# Patient Record
Sex: Female | Born: 1990 | Race: Black or African American | Hispanic: No | Marital: Single | Smoking: Never smoker
Health system: Southern US, Community
[De-identification: ages and names within clinical notes are randomized; demographics above are authoritative.]

## PROBLEM LIST (undated history)

## (undated) DIAGNOSIS — Z789 Other specified health status: Secondary | ICD-10-CM

## (undated) HISTORY — DX: Other specified health status: Z78.9

---

## 2014-12-02 HISTORY — PX: BREAST SURGERY: SHX581

## 2017-11-02 ENCOUNTER — Encounter: Payer: Self-pay | Admitting: Emergency Medicine

## 2017-11-02 ENCOUNTER — Other Ambulatory Visit: Payer: Self-pay

## 2017-11-02 ENCOUNTER — Emergency Department
Admission: EM | Admit: 2017-11-02 | Discharge: 2017-11-02 | Disposition: A | Discharge status: Home / Self Care | Payer: Managed Care, Other (non HMO) | Attending: Emergency Medicine | Admitting: Emergency Medicine

## 2017-11-02 DIAGNOSIS — N764 Abscess of vulva: Secondary | ICD-10-CM | POA: Insufficient documentation

## 2017-11-02 DIAGNOSIS — N758 Other diseases of Bartholin's gland: Secondary | ICD-10-CM | POA: Insufficient documentation

## 2017-11-02 MED ORDER — HYDROCODONE-ACETAMINOPHEN 5-325 MG PO TABS: 1.0000 | ORAL_TABLET | Freq: Four times a day (QID) | ORAL | 0 refills | Status: DC | PRN

## 2017-11-02 MED ORDER — LIDOCAINE-EPINEPHRINE 2 %-1:100000 IJ SOLN
30.0000 mL | Freq: Once | INTRAMUSCULAR | Status: DC
  Filled 2017-11-02: qty 30

## 2017-11-02 MED ORDER — SULFAMETHOXAZOLE-TRIMETHOPRIM 800-160 MG PO TABS: 1.0000 | ORAL_TABLET | Freq: Two times a day (BID) | ORAL | 0 refills | Status: DC

## 2017-11-02 NOTE — Discharge Instructions (Signed)
Pull the packing out in 2 days if it has not fallen out already. Take the antibiotic until finished. See the gynecologist or return to the ER for symptoms that change or worsen.

## 2017-11-02 NOTE — ED Provider Notes (Signed)
[facility] Emergency Department Provider Note  ____________________________________________  Time seen: Approximately 8:23 AM  I have reviewed the triage vital signs and the nursing notes.   HISTORY  Chief Complaint Abscess   HPI Melissa Flores is a 26 y.o. female who presents to the ER for I&D of labial cyst. She was evaluated at urgent care on Friday and given a shot of antibiotics, which has helped but pain continues. She states she has a history of the same about 5 years ago. She denies fever or other concerns.   History reviewed. No pertinent past medical history.  There are no active problems to display for this patient.   Past Surgical History:  Procedure Laterality Date  . BREAST SURGERY      Prior to Admission medications   Medication Sig Start Date End Date Taking? Authorizing Provider  HYDROcodone-acetaminophen (NORCO/VICODIN) 5-325 MG tablet Take 1 tablet by mouth every 6 (six) hours as needed for up to 12 doses for moderate pain. 11/02/17   Jeremyah Jelley, Rulon Eisenmengerari B, FNP  sulfamethoxazole-trimethoprim (BACTRIM DS,SEPTRA DS) 800-160 MG tablet Take 1 tablet by mouth 2 (two) times daily. 11/02/17   Chinita Pesterriplett, Jia Dottavio B, FNP    Allergies Patient has no known allergies.  No family history on file.  Social History Social History   Tobacco Use  . Smoking status: Never Smoker  . Smokeless tobacco: Never Used  Substance Use Topics  . Alcohol use: No    Frequency: Never  . Drug use: No    Review of Systems  Constitutional: Negative for fever. Respiratory: Negative for cough or shortness of breath.  Musculoskeletal: Negative for myalgias Skin: Positive for abscess of left labia Neurological: Negative for numbness or paresthesias. ____________________________________________   PHYSICAL EXAM:  VITAL SIGNS: ED Triage Vitals [11/02/17 0801]  Enc Vitals Group     BP 100/70     Pulse Rate 90     Resp 16     Temp 98.1 F (36.7 C)     Temp  Source Oral     SpO2 100 %     Weight      Height      Head Circumference      Peak Flow      Pain Score 6     Pain Loc      Pain Edu?      Excl. in GC?      Constitutional: Well appearing. Eyes: Conjunctivae are clear without discharge or drainage. Nose: No rhinorrhea noted. Mouth/Throat: Airway is patent.  Neck: No stridor. Unrestricted range of motion observed. Lymphatic: No inguinal nodes palpable  Cardiovascular: Capillary refill is <3 seconds.  Respiratory: Respirations are even and unlabored.. Musculoskeletal: Unrestricted range of motion observed. Neurologic: Awake, alert, and oriented x 4.  Skin:  Left labia majora fluctuant and erythematous  ____________________________________________   LABS (all labs ordered are listed, but only abnormal results are displayed)  Labs Reviewed - No data to display ____________________________________________  EKG  Not indicated. ____________________________________________  RADIOLOGY  Not indicated. ____________________________________________   PROCEDURES  .Marland Kitchen.Incision and Drainage Date/Time: 11/02/2017 10:25 AM Performed by: Chinita Pesterriplett, Alonte Wulff B, FNP Authorized by: Chinita Pesterriplett, Matie Dimaano B, FNP   Consent:    Consent obtained:  Verbal   Consent given by:  Patient   Risks discussed:  Bleeding, infection, incomplete drainage and pain   Alternatives discussed:  Alternative treatment, delayed treatment and observation Location:    Type:  Abscess   Location:  Anogenital   Anogenital location: Left labia.  Pre-procedure details:    Skin preparation:  Betadine Anesthesia (see MAR for exact dosages):    Anesthesia method:  Local infiltration   Local anesthetic:  Lidocaine 1% w/o epi Procedure type:    Complexity:  Complex Procedure details:    Needle aspiration: no     Incision types:  Stab incision   Incision depth:  Dermal   Scalpel blade:  11   Wound management:  Probed and deloculated   Drainage:  Bloody and purulent    Drainage amount:  Moderate   Wound treatment:  Drain placed   Packing materials:  1/4 in iodoform gauze Post-procedure details:    Patient tolerance of procedure:  Tolerated well, no immediate complications   ____________________________________________   INITIAL IMPRESSION / ASSESSMENT AND PLAN / ED COURSE  Melissa ShuJenessa Mokry is a 26 y.o. female who presents to the emergency department for I&D of a labia abscess. She tolerated the procedure well. She is to pull the packing out in 2 days and take a course of Bactrim. She is to follow up with gynecology for further evaluation if needed and if the infection returns. She is to return to the ER for symptoms that change or worsen if unable to schedule an appointment.  Medications  lidocaine-EPINEPHrine (XYLOCAINE W/EPI) 2 %-1:100000 (with pres) injection 30 mL (not administered)     Pertinent labs & imaging results that were available during my care of the patient were reviewed by me and considered in my medical decision making (see chart for details). ____________________________________________   FINAL CLINICAL IMPRESSION(S) / ED DIAGNOSES  Final diagnoses:  Left genital labial abscess    ED Discharge Orders        Ordered    sulfamethoxazole-trimethoprim (BACTRIM DS,SEPTRA DS) 800-160 MG tablet  2 times daily     11/02/17 0905    HYDROcodone-acetaminophen (NORCO/VICODIN) 5-325 MG tablet  Every 6 hours PRN     11/02/17 0905       Note:  This document was prepared using Dragon voice recognition software and may include unintentional dictation errors.    Chinita Pesterriplett, Ephrem Carrick B, FNP 11/02/17 1031    Sharyn CreamerQuale, Mark, MD 11/02/17 [phone]

## 2017-11-02 NOTE — ED Triage Notes (Signed)
Pt states that she was seen at urgent care on Friday for left labia cyst, pt was instructed to come to ED to have cyst drained but was unable to come on Friday. Pt states that area is painful, hurt to sit. Pt in NAD.

## 2017-11-02 NOTE — ED Notes (Signed)
First Nurse Note: Pt ambulatory to front desk c/o cyst on left labia that she would like to have drained. Pt in NAD at this time.

## 2017-11-05 ENCOUNTER — Encounter: Payer: Self-pay | Admitting: Obstetrics & Gynecology

## 2017-11-13 ENCOUNTER — Encounter: Payer: Self-pay | Admitting: Obstetrics and Gynecology

## 2017-11-13 ENCOUNTER — Ambulatory Visit (INDEPENDENT_AMBULATORY_CARE_PROVIDER_SITE_OTHER): Payer: Self-pay | Admitting: Obstetrics and Gynecology

## 2017-11-13 VITALS — BP 110/60 | HR 65 | Ht 62.0 in | Wt 103.0 lb

## 2017-11-13 DIAGNOSIS — N758 Other diseases of Bartholin's gland: Secondary | ICD-10-CM

## 2017-11-13 DIAGNOSIS — Z09 Encounter for follow-up examination after completed treatment for conditions other than malignant neoplasm: Secondary | ICD-10-CM

## 2017-11-13 NOTE — Progress Notes (Signed)
   Patient ID: Melissa Flores, female   DOB: 09/05/1991, 26 y.o.   MRN: [id number]  Reason for Consult: Follow-up (ER f/u labial abcess)   Referred by No ref. provider found  Subjective:     HPI:  Melissa Flores is a 26 y.o. female  Patient following up from the ER for drainage of a bartholin gland abscess. Healing well. Some discomfort with intercourse.  No drainage, no induration.   Past Medical History:  Diagnosis Date  . No known health problems    Family History  Problem Relation Age of Onset  . Endometriosis Mother   . Hypertension Mother   . Hepatitis C Maternal Aunt   . Diabetes Maternal Aunt   . Kidney failure Maternal Aunt   . Aneurysm Paternal Grandmother   . COPD Paternal Grandmother    Past Surgical History:  Procedure Laterality Date  . BREAST SURGERY  2016   lump on left breast    Short Social History:  Social History   Tobacco Use  . Smoking status: Never Smoker  . Smokeless tobacco: Never Used  Substance Use Topics  . Alcohol use: No    Frequency: Never    No Known Allergies  Current Outpatient Medications  Medication Sig Dispense Refill  . HYDROcodone-acetaminophen (NORCO/VICODIN) 5-325 MG tablet Take 1 tablet by mouth every 6 (six) hours as needed for up to 12 doses for moderate pain. (Patient not taking: Reported on 11/13/2017) 12 tablet 0  . sulfamethoxazole-trimethoprim (BACTRIM DS,SEPTRA DS) 800-160 MG tablet Take 1 tablet by mouth 2 (two) times daily. (Patient not taking: Reported on 11/13/2017) 20 tablet 0   No current facility-administered medications for this visit.     Review of Systems  Constitutional:  Constitutional negative. HENT: HENT negative.  Respiratory: Respiratory negative.  Cardiovascular: Cardiovascular negative.  GI: Gastrointestinal negative.  GU: Genitourinary negative. Musculoskeletal: Musculoskeletal negative.  Skin: Skin negative.  Neurological: Neurological negative. Hematologic: Hematologic/lymphatic  negative.  Psychiatric: Psychiatric negative.        Objective:  Objective   Vitals:   11/13/17 1338  BP: 110/60  Pulse: 65  Weight: 103 lb (46.7 kg)  Height: 5\' 2"  (1.575 m)   Body mass index is 18.84 kg/m.  Physical Exam  Constitutional: She appears well-developed and well-nourished.  Cardiovascular: Normal rate and regular rhythm.  Pulmonary/Chest: Effort normal and breath sounds normal.  Abdominal: Soft. Bowel sounds are normal.  Genitourinary: Vagina normal and uterus normal.  Genitourinary Comments: Small incision site inside of vaginal introitus on patients left side. Healing well. Tender to palpation. No drainage, no induration.        Assessment/Plan:    25yo with recent Bartholin Gland drainage.  Healing well, recommended vasoline and 2 weeks of pelvic rest to allow incision site to fully heal  Natale Milchhristanna R Orlo Brickle MD 11/16/17 10:18 PM

## 2017-11-27 ENCOUNTER — Ambulatory Visit: Payer: Medicaid Other | Admitting: Obstetrics and Gynecology

## 2017-12-18 ENCOUNTER — Ambulatory Visit: Payer: Medicaid Other | Admitting: Obstetrics and Gynecology

## 2018-02-20 ENCOUNTER — Encounter: Payer: Self-pay | Admitting: Advanced Practice Midwife

## 2018-03-04 ENCOUNTER — Encounter: Payer: Self-pay | Admitting: Emergency Medicine

## 2018-03-04 ENCOUNTER — Other Ambulatory Visit: Payer: Self-pay

## 2018-03-04 ENCOUNTER — Emergency Department
Admission: EM | Admit: 2018-03-04 | Discharge: 2018-03-04 | Disposition: A | Discharge status: Home / Self Care | Payer: Managed Care, Other (non HMO) | Attending: Emergency Medicine | Admitting: Emergency Medicine

## 2018-03-04 ENCOUNTER — Emergency Department: Payer: Managed Care, Other (non HMO)

## 2018-03-04 DIAGNOSIS — R109 Unspecified abdominal pain: Secondary | ICD-10-CM | POA: Diagnosis not present

## 2018-03-04 DIAGNOSIS — O99611 Diseases of the digestive system complicating pregnancy, first trimester: Secondary | ICD-10-CM | POA: Insufficient documentation

## 2018-03-04 DIAGNOSIS — O26899 Other specified pregnancy related conditions, unspecified trimester: Secondary | ICD-10-CM

## 2018-03-04 DIAGNOSIS — R102 Pelvic and perineal pain: Secondary | ICD-10-CM

## 2018-03-04 DIAGNOSIS — K529 Noninfective gastroenteritis and colitis, unspecified: Secondary | ICD-10-CM | POA: Diagnosis not present

## 2018-03-04 DIAGNOSIS — Z3A01 Less than 8 weeks gestation of pregnancy: Secondary | ICD-10-CM | POA: Diagnosis not present

## 2018-03-04 DIAGNOSIS — O26891 Other specified pregnancy related conditions, first trimester: Secondary | ICD-10-CM

## 2018-03-04 LAB — ABO/RH: ABO/RH(D): O POS

## 2018-03-04 LAB — CBC WITH DIFFERENTIAL/PLATELET
BASOS PCT: 0 %
Basophils Absolute: 0 10*3/uL (ref 0–0.1)
EOS PCT: 0 %
Eosinophils Absolute: 0 10*3/uL (ref 0–0.7)
HCT: 36.3 % (ref 35.0–47.0)
HEMOGLOBIN: 12.1 g/dL (ref 12.0–16.0)
Lymphocytes Relative: 21 %
Lymphs Abs: 2.3 10*3/uL (ref 1.0–3.6)
MCH: 29.6 pg (ref 26.0–34.0)
MCHC: 33.5 g/dL (ref 32.0–36.0)
MCV: 88.2 fL (ref 80.0–100.0)
MONOS PCT: 6 %
Monocytes Absolute: 0.7 10*3/uL (ref 0.2–0.9)
NEUTROS PCT: 73 %
Neutro Abs: 7.7 10*3/uL — ABNORMAL HIGH (ref 1.4–6.5)
Platelets: 231 10*3/uL (ref 150–440)
RBC: 4.11 MIL/uL (ref 3.80–5.20)
RDW: 12.7 % (ref 11.5–14.5)
WBC: 10.7 10*3/uL (ref 3.6–11.0)

## 2018-03-04 LAB — HCG, QUANTITATIVE, PREGNANCY: hCG, Beta Chain, Quant, S: 84316 m[IU]/mL — ABNORMAL HIGH (ref <5)

## 2018-03-04 LAB — URINALYSIS, COMPLETE (UACMP) WITH MICROSCOPIC
BACTERIA UA: NONE SEEN
Bilirubin Urine: NEGATIVE
Glucose, UA: >=500 mg/dL — AB
Hgb urine dipstick: NEGATIVE
Ketones, ur: 20 mg/dL — AB
Leukocytes, UA: NEGATIVE
Nitrite: NEGATIVE
Protein, ur: NEGATIVE mg/dL
SPECIFIC GRAVITY, URINE: 1.002 — AB (ref 1.005–1.030)
pH: 8 (ref 5.0–8.0)

## 2018-03-04 LAB — COMPREHENSIVE METABOLIC PANEL
ALBUMIN: 4.3 g/dL (ref 3.5–5.0)
ALK PHOS: 47 U/L (ref 38–126)
ALT: 13 U/L — ABNORMAL LOW (ref 14–54)
ANION GAP: 7 (ref 5–15)
AST: 18 U/L (ref 15–41)
BUN: <5 mg/dL — ABNORMAL LOW (ref 6–20)
CALCIUM: 9.4 mg/dL (ref 8.9–10.3)
CO2: 22 mmol/L (ref 22–32)
Chloride: 107 mmol/L (ref 101–111)
Creatinine, Ser: 0.61 mg/dL (ref 0.44–1.00)
GFR calc non Af Amer: >60 mL/min (ref >60)
GLUCOSE: 83 mg/dL (ref 65–99)
POTASSIUM: 3.6 mmol/L (ref 3.5–5.1)
Sodium: 136 mmol/L (ref 135–145)
Total Bilirubin: 0.8 mg/dL (ref 0.3–1.2)
Total Protein: 7.7 g/dL (ref 6.5–8.1)

## 2018-03-04 LAB — LIPASE, BLOOD: Lipase: 21 U/L (ref 11–51)

## 2018-03-04 MED ORDER — ONDANSETRON 4 MG PO TBDP: 4.0000 mg | ORAL_TABLET | Freq: Three times a day (TID) | ORAL | 0 refills | Status: AC | PRN

## 2018-03-04 MED ORDER — DEXTROSE 5 % AND 0.9 % NACL IV BOLUS: 1000.0000 mL | Freq: Once | INTRAVENOUS | Status: DC

## 2018-03-04 MED ORDER — ONDANSETRON HCL 4 MG/2ML IJ SOLN
4.0000 mg | Freq: Once | INTRAMUSCULAR | Status: AC
  Administered 2018-03-04: 4 mg via INTRAVENOUS
  Filled 2018-03-04: qty 2

## 2018-03-04 MED ORDER — DEXTROSE IN LACTATED RINGERS 5 % IV SOLN
INTRAVENOUS | Status: DC
  Administered 2018-03-04: 08:00:00 via INTRAVENOUS

## 2018-03-04 MED ORDER — DEXTROSE IN LACTATED RINGERS 5 % IV SOLN
INTRAVENOUS | Status: DC
Start: 2018-03-04 — End: 2018-03-04
  Administered 2018-03-04: 08:00:00 via INTRAVENOUS

## 2018-03-04 MED ORDER — PROMETHAZINE HCL 25 MG/ML IJ SOLN
25.0000 mg | Freq: Once | INTRAMUSCULAR | Status: AC
  Administered 2018-03-04: 25 mg via INTRAVENOUS
  Filled 2018-03-04: qty 1

## 2018-03-04 MED ORDER — LORAZEPAM 2 MG/ML IJ SOLN
0.5000 mg | Freq: Once | INTRAMUSCULAR | Status: AC
  Administered 2018-03-04: 0.5 mg via INTRAVENOUS
  Filled 2018-03-04: qty 1

## 2018-03-04 NOTE — ED Notes (Signed)
Pt sitting up in bed attempting to call a ride. Pt given meal tray and saltines per her request. Will continue to monitor for further patient needs.

## 2018-03-04 NOTE — ED Notes (Signed)
Pt resting comfortably in bed at this time with eyes closed. Respirations even and unlabored. Skin warm, dry, and intact. Will continue to monitor for further patient needs.

## 2018-03-04 NOTE — ED Notes (Signed)
Pt returned from US at this time.

## 2018-03-04 NOTE — ED Notes (Signed)
Pt states when she was attempting to eat meal tray that was provided she began to vomit. MD made aware.

## 2018-03-04 NOTE — ED Provider Notes (Signed)
[facility] Emergency Department Provider Note       Time seen: ----------------------------------------- 7:30 AM on 03/04/2018 -----------------------------------------   I have reviewed the triage vital signs and the nursing notes.  HISTORY   Chief Complaint Abdominal Pain   HPI Melissa Flores is a 27 y.o. female with no significant past medical history who presents to the ED for abdominal pain that started this morning.  Patient also complains of nausea, vomiting and diarrhea for the past several days.  Patient states she is around [redacted] weeks pregnant, denies any vaginal bleeding or leakage of fluid.  She is G1 P0.  Patient states symptoms worsened at approximately 3:00 this morning, she arrives by EMS for same.  Past Medical History:  Diagnosis Date  . No known health problems     There are no active problems to display for this patient.   Past Surgical History:  Procedure Laterality Date  . BREAST SURGERY  2016   lump on left breast    Allergies Patient has no known allergies.  Social History Social History   Tobacco Use  . Smoking status: Never Smoker  . Smokeless tobacco: Never Used  Substance Use Topics  . Alcohol use: No    Frequency: Never  . Drug use: No   Review of Systems Constitutional: Negative for fever. Cardiovascular: Negative for chest pain. Respiratory: Negative for shortness of breath. Gastrointestinal: Positive for abdominal pain, vomiting and diarrhea Genitourinary: Negative for dysuria. negative for vaginal bleeding Musculoskeletal: Negative for back pain. Skin: Negative for rash. Neurological: Negative for headaches, focal weakness or numbness.  All systems negative/normal/unremarkable except as stated in the HPI  ____________________________________________   PHYSICAL EXAM:  VITAL SIGNS: ED Triage Vitals  Enc Vitals Group     BP 03/04/18 0729 132/86     Pulse Rate 03/04/18 0729 86     Resp 03/04/18 0729  18     Temp --      Temp src --      SpO2 03/04/18 0729 100 %     Weight 03/04/18 0730 108 lb (49 kg)     Height 03/04/18 0730 5\' 2"  (1.575 m)     Head Circumference --      Peak Flow --      Pain Score 03/04/18 0730 8     Pain Loc --      Pain Edu? --      Excl. in GC? --    Constitutional: Alert and oriented.  Anxious, no distress Eyes: Conjunctivae are normal. Normal extraocular movements. ENT   Head: Normocephalic and atraumatic.   Nose: No congestion/rhinnorhea.   Mouth/Throat: Mucous membranes are moist.   Neck: No stridor. Cardiovascular: Normal rate, regular rhythm. No murmurs, rubs, or gallops. Respiratory: Normal respiratory effort without tachypnea nor retractions. Breath sounds are clear and equal bilaterally. No wheezes/rales/rhonchi. Gastrointestinal: Nonfocal tenderness, no rebound or guarding.  Normal bowel sounds. Musculoskeletal: Nontender with normal range of motion in extremities. No lower extremity tenderness nor edema. Neurologic:  Normal speech and language. No gross focal neurologic deficits are appreciated.  Skin:  Skin is warm, dry and intact. No rash noted. Psychiatric: Anxious mood and affect ____________________________________________  ED COURSE:  As part of my medical decision making, I reviewed the following data within the electronic MEDICAL RECORD NUMBER History obtained from family if available, nursing notes, old chart and ekg, as well as notes from prior ED visits. Patient presented for symptoms of gastroenteritis and pregnancy, we will assess with  labs and imaging as indicated at this time.   Procedures ____________________________________________   LABS (pertinent positives/negatives)  Labs Reviewed  CBC WITH DIFFERENTIAL/PLATELET - Abnormal; Notable for the following components:      Result Value   Neutro Abs 7.7 (*)    All other components within normal limits  COMPREHENSIVE METABOLIC PANEL - Abnormal; Notable for the  following components:   BUN <5 (*)    ALT 13 (*)    All other components within normal limits  HCG, QUANTITATIVE, PREGNANCY - Abnormal; Notable for the following components:   hCG, Beta Chain, Quant, S 84,316 (*)    All other components within normal limits  URINALYSIS, COMPLETE (UACMP) WITH MICROSCOPIC - Abnormal; Notable for the following components:   Color, Urine STRAW (*)    APPearance CLEAR (*)    Specific Gravity, Urine 1.002 (*)    Glucose, UA >=500 (*)    Ketones, ur 20 (*)    Squamous Epithelial / LPF 0-5 (*)    All other components within normal limits  LIPASE, BLOOD  ABO/RH    RADIOLOGY  Pregnancy ultrasound  IMPRESSION: 1. Unremarkable single intrauterine pregnancy with estimated gestational age of [redacted] weeks and 4 days. 2. Normal sonographic appearance of the ovaries bilaterally with normal arterial and venous waveforms. 3. Probable corpus luteal cyst on the left. 4. Small amount of free fluid.  The  ____________________________________________  DIFFERENTIAL DIAGNOSIS   Gastroenteritis, gastritis, ectopic pregnancy, normal pregnancy  FINAL ASSESSMENT AND PLAN  Gastroenteritis, first trimester pregnancy   Plan: The patient had presented for symptoms of gastroenteritis in the first trimester pregnancy. Patient's labs were reassuring. Patient's imaging was also reassuring.  She is tolerating food and liquids by mouth.  She will be discharged with Zofran, she is stable for outpatient follow-up.   Ulice Dash, MD   Note: This note was generated in part or whole with voice recognition software. Voice recognition is usually quite accurate but there are transcription errors that can and very often do occur. I apologize for any typographical errors that were not detected and corrected.     Melissa Filbert, MD 03/04/18 1055

## 2018-03-04 NOTE — ED Notes (Signed)
NAD noted at time of D/C. While this RN was reviewing D/C instructions pt states that she is angry with [employer] due to being charged for cancellation. Pt appears distracted during review of D/C instructions. When this RN asked patient to sign for D/C instructions pt sat in bed for several minutes prior to coming to sign for D/C instructions, ignoring this RN in hallway. Pt denies any comments or concerns. Pt ambulatory to the bathroom without difficulty. This RN reiterated that patient was okay for D/C after signing and getting dressed, pt states understanding then sat down on bed and began to attempt to call [employer] regarding her charges to her account. Collyn, RN made aware of patient situation.

## 2018-03-04 NOTE — ED Notes (Signed)
Pt encouraged to attempt to eat after receiving Zofran. Will recheck patient progress and continue to monitor.

## 2018-03-04 NOTE — ED Triage Notes (Signed)
Pt presents to ED via [facility] with c/o upper abdominal pain that started this morning. Per EMS pt also c/o N/V/D x several days. Pt states symptoms worsened this morning at approx 0300. Per EMS LMP was 01/11/2018. Pt states she is [redacted] weeks pregnant, pt denies any vaginal bleeding at this time.

## 2018-03-04 NOTE — ED Notes (Signed)
Pt assisted to the bathroom by this RN. UA collected. Pt back to bed without incident. Will continue to monitor for further patient needs.

## 2018-03-04 NOTE — ED Notes (Signed)
Pt resting in bed with NAD noted. Will continue to monitor for further patient needs. Will attempt to PO challenge patient again since she received the Zofran.

## 2018-03-04 NOTE — ED Notes (Signed)
Pt transported to US

## 2018-03-06 ENCOUNTER — Ambulatory Visit (INDEPENDENT_AMBULATORY_CARE_PROVIDER_SITE_OTHER): Payer: Managed Care, Other (non HMO) | Admitting: Advanced Practice Midwife

## 2018-03-06 ENCOUNTER — Encounter: Payer: Self-pay | Admitting: Advanced Practice Midwife

## 2018-03-06 VITALS — BP 114/78 | Ht 62.0 in | Wt 108.0 lb

## 2018-03-06 DIAGNOSIS — Z09 Encounter for follow-up examination after completed treatment for conditions other than malignant neoplasm: Secondary | ICD-10-CM | POA: Diagnosis not present

## 2018-03-06 DIAGNOSIS — R109 Unspecified abdominal pain: Secondary | ICD-10-CM | POA: Diagnosis not present

## 2018-03-06 NOTE — Progress Notes (Signed)
  History of Present Illness:  Melissa Flores is a 27 y.o. G1P0 who is in the office today for a follow up from an ED visit 2 days ago. She had been having nausea, vomiting and diarrhea for several days and she was diagnosed with gastroenteritis. She also had an OB u/s with [facility] dating [id number]. She denies nausea and vomiting earlier in the pregnancy. She was discharged with a prescription for zofran. Her vomiting has stopped since her visit to the ED. She does complain of continuing "stomach" cramping (while indicating general abdominal area) and says she feels as bad as she did when she went to the ED. Today she denies fever, nausea, vomiting, diarrhea, constipation. She plans to have an abortion next Saturday.   She requests STD testing today since she was told by her mother that her abdominal pain could be PID. The patient has a new sexual partner since November of last year. She denies itching, irritation, burning, discharge or odor. She has not been using birth control or condoms to prevent STDs and she has not been tested recently. She is undecided on method of birth control at this time.   Current Meds  Medication Sig  . hydrOXYzine (ATARAX/VISTARIL) 10 MG tablet Take 1 tablet by mouth daily.  . ondansetron (ZOFRAN ODT) 4 MG disintegrating tablet Take 1 tablet (4 mg total) by mouth every 8 (eight) hours as needed for nausea or vomiting.    PMHx: She  has a past medical history of No known health problems. Also,  has a past surgical history that includes Breast surgery (2016)., family history includes Aneurysm in her paternal grandmother; COPD in her paternal grandmother; Diabetes in her maternal aunt; Endometriosis in her mother; Hepatitis C in her maternal aunt; Hypertension in her mother; Kidney failure in her maternal aunt.,  reports that she has never smoked. She has never used smokeless tobacco. She reports that she does not drink alcohol or use drugs.   Has no known allergies  Review of Systems   Constitutional: Negative.   HENT: Negative.   Eyes: Negative.   Respiratory: Negative.   Cardiovascular: Negative.   Gastrointestinal: Positive for abdominal pain.  Genitourinary: Positive for frequency.  Musculoskeletal: Negative.   Skin: Negative.   Neurological: Positive for headaches.  Endo/Heme/Allergies: Negative.   Psychiatric/Behavioral: Negative.     Physical Exam:  BP 114/78   Ht 5\' 2"  (1.575 m)   Wt 108 lb (49 kg)   LMP 01/11/2018   BMI 19.75 kg/m  Body mass index is 19.75 kg/m. Constitutional: Well nourished, well developed female in no acute distress.  Abdomen: diffusely mildly tender to palpation, non distended, and no masses, hernias Neuro: Grossly intact Psych:  Normal mood and affect.   Genital: EGBUS within normal limits Vagina: normal mucosa, scant discharge, no odor Cervix: appearance normal  Assessment:  Problem List Items Addressed This Visit    None    Visit Diagnoses    Follow up    -  Primary   Abdominal cramping       Relevant Orders   Chlamydia/Gonococcus/Trichomonas, NAA       Plan: Follow up PRN and for annual exam/PAP smear  Bland diet and advance as tolerated  Increase hydration  Follow up with urgent care for continued or worsening abdominal cramping/gastroenteritis symptoms  Use birth control/condoms to prevent pregnancy/STDs   Melissa Flores, CNM [facility], [facility] 03/06/2018  11:20 AM

## 2018-03-09 LAB — CHLAMYDIA/GONOCOCCUS/TRICHOMONAS, NAA
CHLAMYDIA BY NAA: NEGATIVE
GONOCOCCUS BY NAA: NEGATIVE
Trich vag by NAA: NEGATIVE

## 2018-03-11 ENCOUNTER — Ambulatory Visit (INDEPENDENT_AMBULATORY_CARE_PROVIDER_SITE_OTHER): Payer: Managed Care, Other (non HMO) | Admitting: Obstetrics and Gynecology

## 2018-03-11 ENCOUNTER — Encounter: Payer: Self-pay | Admitting: Obstetrics and Gynecology

## 2018-03-11 VITALS — BP 110/68 | Ht 62.0 in | Wt 106.0 lb

## 2018-03-11 DIAGNOSIS — R1032 Left lower quadrant pain: Secondary | ICD-10-CM | POA: Diagnosis not present

## 2018-03-11 DIAGNOSIS — O26899 Other specified pregnancy related conditions, unspecified trimester: Secondary | ICD-10-CM

## 2018-03-11 NOTE — Progress Notes (Addendum)
Obstetrics & Gynecology Office Visit   Chief Complaint  Patient presents with  . Follow-up  . Pelvic Pain   History of Present Illness: 27 y.o. G1P0000 female at approximately [redacted]w[redacted]d gestational age with an EDD of 10/17/18. She presents with continued abdominal pain. Her pain is in her lower abdomen and the pain does not radiate. She has a hard time describing her pain, but states that it is most like a pressure sensation.  Nothing alleviates her pain (she has tried a heating pad and zofran), bending over aggravates the pain. Associated symptoms include nausea (no emesis when she takes her previously prescribed zofran). She is also quite constipated and can't tolerate eating very much food.  Denies vaginal bleeding, hematuria, and hematochezia and melena.  She plans to terminate this pregnancy in the next few days. She otherwise has not formally began prenatal care.  She denies any inciting event. She has had no abdominal trauma. She had a gastrointestinal viral illness last week with which she had nausea, emesis, and diarrhea.    Past Medical History:  Diagnosis Date  . No known health problems     Past Surgical History:  Procedure Laterality Date  . BREAST SURGERY  2016   lump on left breast    Gynecologic History: Patient's last menstrual period was 01/11/2018.  Obstetric History: G1P0000  Family History  Problem Relation Age of Onset  . Endometriosis Mother   . Hypertension Mother   . Hepatitis C Maternal Aunt   . Diabetes Maternal Aunt   . Kidney failure Maternal Aunt   . Aneurysm Paternal Grandmother   . COPD Paternal Grandmother     Social History   Socioeconomic History  . Marital status: Single    Spouse name: Not on file  . Number of children: Not on file  . Years of education: Not on file  . Highest education level: Not on file  Occupational History  . Not on file  Social Needs  . Financial resource strain: Not on file  . Food insecurity:    Worry: Not on  file    Inability: Not on file  . Transportation needs:    Medical: Not on file    Non-medical: Not on file  Tobacco Use  . Smoking status: Never Smoker  . Smokeless tobacco: Never Used  Substance and Sexual Activity  . Alcohol use: No    Frequency: Never  . Drug use: No  . Sexual activity: Yes    Birth control/protection: None  Lifestyle  . Physical activity:    Days per week: 0 days    Minutes per session: 0 min  . Stress: Not at all  Relationships  . Social connections:    Talks on phone: Not on file    Gets together: Not on file    Attends religious service: Not on file    Active member of club or organization: Not on file    Attends meetings of clubs or organizations: Not on file    Relationship status: Not on file  . Intimate partner violence:    Fear of current or ex partner: Not on file    Emotionally abused: Not on file    Physically abused: Not on file    Forced sexual activity: Not on file  Other Topics Concern  . Not on file  Social History Narrative  . Not on file    No Known Allergies  Prior to Admission medications   Medication Sig Start Date End Date  Taking? Authorizing Provider  hydrOXYzine (ATARAX/VISTARIL) 10 MG tablet Take 1 tablet by mouth daily. 02/14/18   [provider]  ondansetron (ZOFRAN ODT) 4 MG disintegrating tablet Take 1 tablet (4 mg total) by mouth every 8 (eight) hours as needed for nausea or vomiting. 03/04/18   Emily FilbertWilliams, Jonathan E, MD    Review of Systems  Constitutional: Negative for chills, fever and malaise/fatigue.  HENT: Negative.   Eyes: Negative.   Respiratory: Negative.   Cardiovascular: Negative.   Gastrointestinal: Positive for abdominal pain, constipation and nausea. Negative for blood in stool, diarrhea, heartburn, melena and vomiting.  Genitourinary: Negative.   Musculoskeletal: Negative.   Skin: Negative.   Neurological: Negative.   Endo/Heme/Allergies: Negative.   Psychiatric/Behavioral: Negative.        Physical Exam BP 110/68   Ht 5\' 2"  (1.575 m)   Wt 106 lb (48.1 kg)   LMP 01/11/2018   BMI 19.39 kg/m  Patient's last menstrual period was 01/11/2018. Physical Exam  Constitutional: She is oriented to person, place, and time. She appears well-developed and well-nourished. She appears distressed (mild).  HENT:  Head: Normocephalic and atraumatic.  Eyes: Conjunctivae are normal. No scleral icterus.  Neck: Normal range of motion. Neck supple. No thyromegaly present.  Cardiovascular: Normal rate and regular rhythm. Exam reveals no gallop and no friction rub.  No murmur heard. Pulmonary/Chest: Effort normal and breath sounds normal. No respiratory distress.  Abdominal: Soft. Bowel sounds are normal. She exhibits no distension and no mass. There is tenderness (predominantly LLQ, mild). There is no rebound and no guarding. No hernia.  Musculoskeletal: Normal range of motion. She exhibits no edema.  Lymphadenopathy:    She has no cervical adenopathy.  Neurological: She is alert and oriented to person, place, and time. No cranial nerve deficit.  Skin: Skin is warm. No erythema.  Psychiatric: She has a normal mood and affect. Her behavior is normal. Judgment normal.   Bedside u/s shows single, living intrauterine pregnancy measuring ~2 cm giving EDD of 10/17/18, consistent with currently established EDD. No evidence of subchorionic hemorrhage or adnexal processes. No abdominal free fluid noted on transabdominal ultrasound.  Assessment: 27 y.o. 581P0000 female here for  1. Left lower quadrant abdominal pain affecting pregnancy      Plan: Problem List Items Addressed This Visit    None    Visit Diagnoses    Left lower quadrant abdominal pain affecting pregnancy    -  Primary     Discussed her workup to date has shown no obvious etiology. Abdominal pain could be due to constipation.  Recommend a stool softener for her (colace or Miralax).  If her symptoms worsen or localize to her right  lower quadrant or right upper quadrant she should go to the ER. She should also go to the ER should she develop fevers, chills, and blood in her stool.   She was given samples of Bonjesta for pregnancy-specific nausea in addition to Zofran. Discussed Zofran in pregnancy, should she change her mind and decide to continue with her pregnancy. She was also encouraged to return to our clinic should she want to continue her pregnancy so that she could formally establish obstetrical care.    20 minutes spent in face to face discussion with > 50% spent in counseling,management, and coordination of care of her left lower quadrant abdominal pain in pregnancy.   Thomasene MohairStephen Jarman Litton, MD 03/11/2018 5:44 PM

## 2018-03-25 ENCOUNTER — Telehealth: Payer: Self-pay

## 2018-03-25 NOTE — Telephone Encounter (Signed)
Pt needs doctors note for being seen 4/5 and 4/10 for f/u ED appts.  [phone]  Pt would like to have it faxed to [facility] at [phone] Adv I would fax it to her.

## 2020-03-06 IMAGING — US US ART/VEN ABD/PELV/SCROTUM DOPPLER COMPLETE
1 series · 13 of 25 positions shown · non-contrast
Comparison: None for this pregnancy.

CLINICAL DATA: Pelvic pain. First-trimester pregnancy. Nausea and
vomiting with progression last night and this morning.



[Series 1: us art/ven abd/pelv/scrotum doppler complete · 0.19mm/px · 13 of 99 slices shown]
[im 1/99]
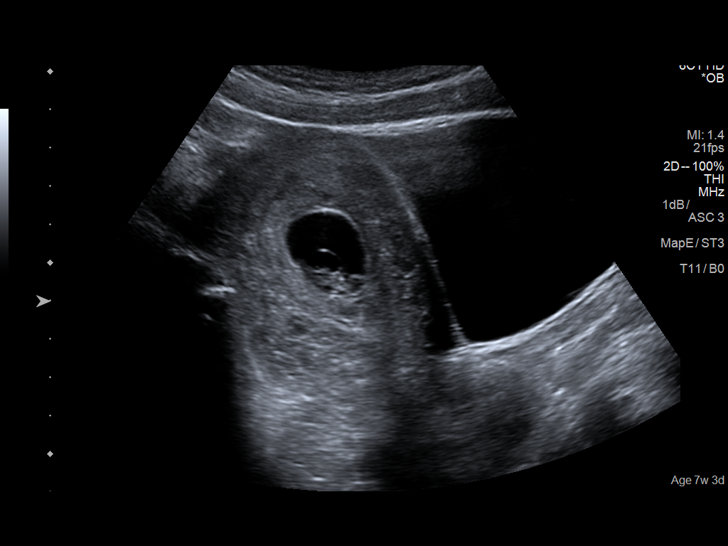
[im 9/99]
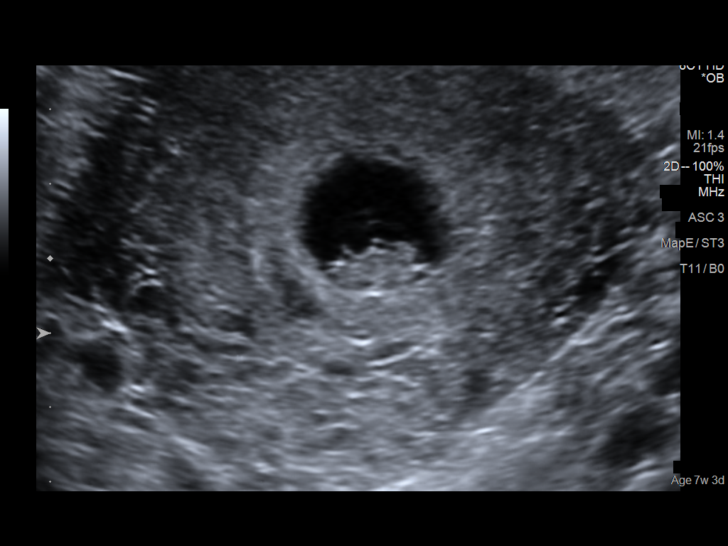
[im 17/99]
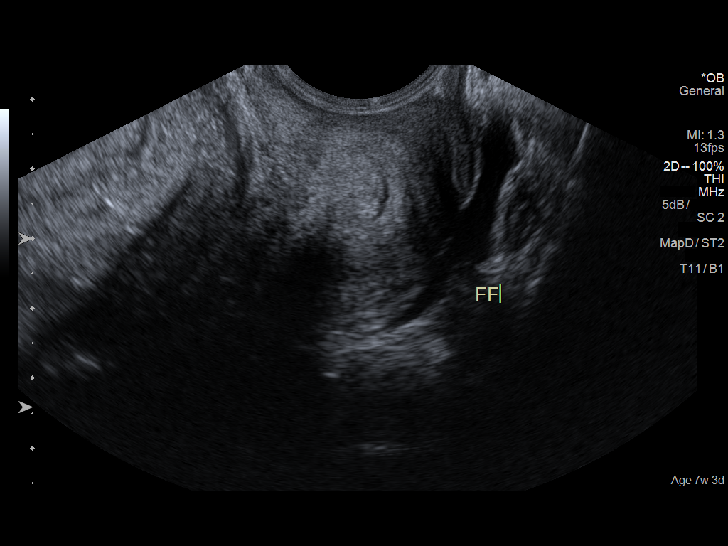
[im 25/99]
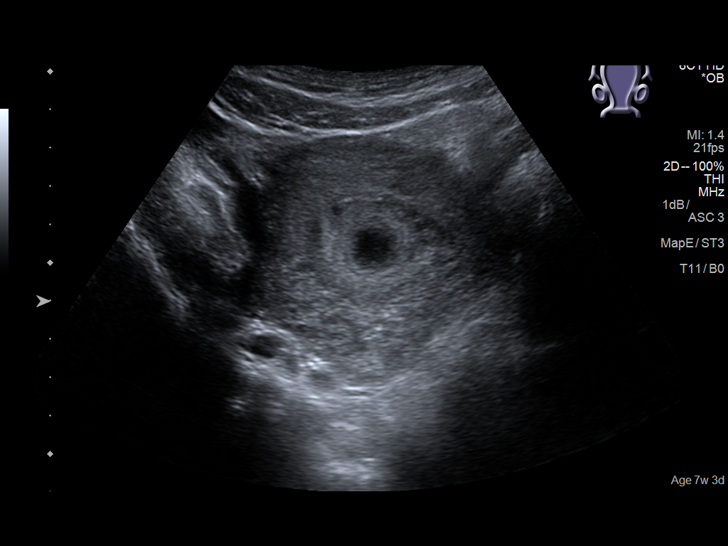
[im 33/99]
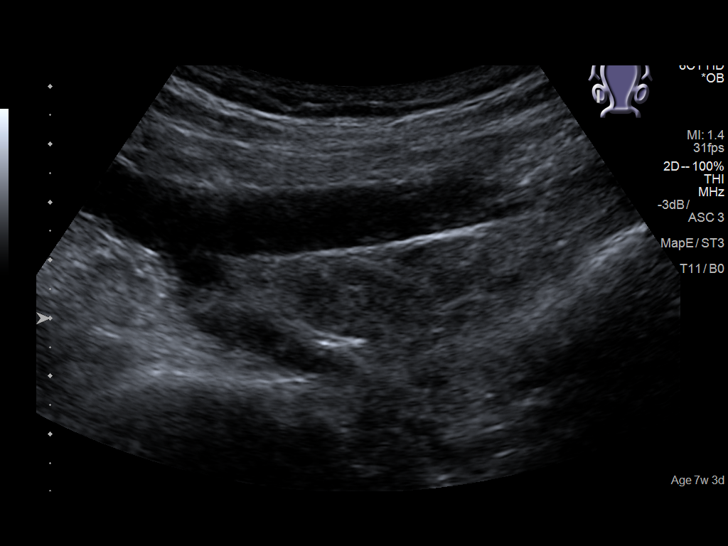
[im 41/99]
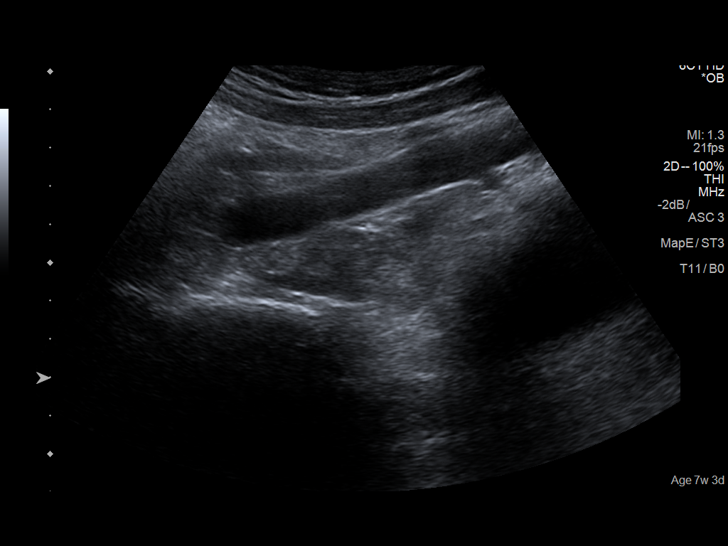
[im 50/99]
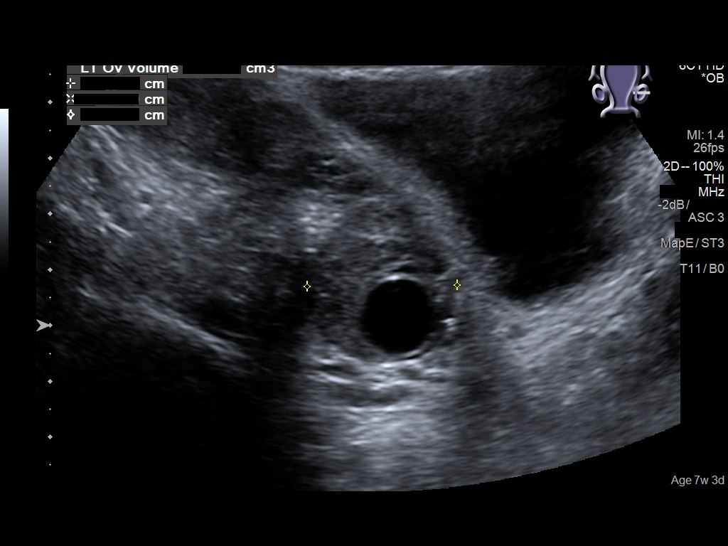
[im 58/99]
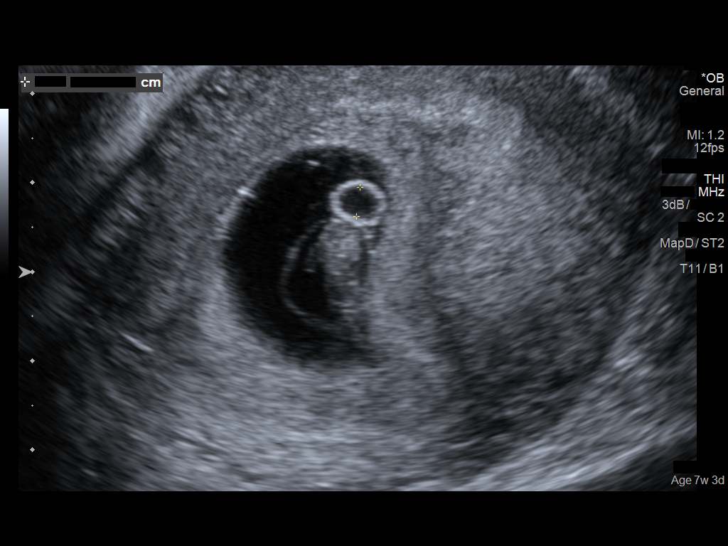
[im 66/99]
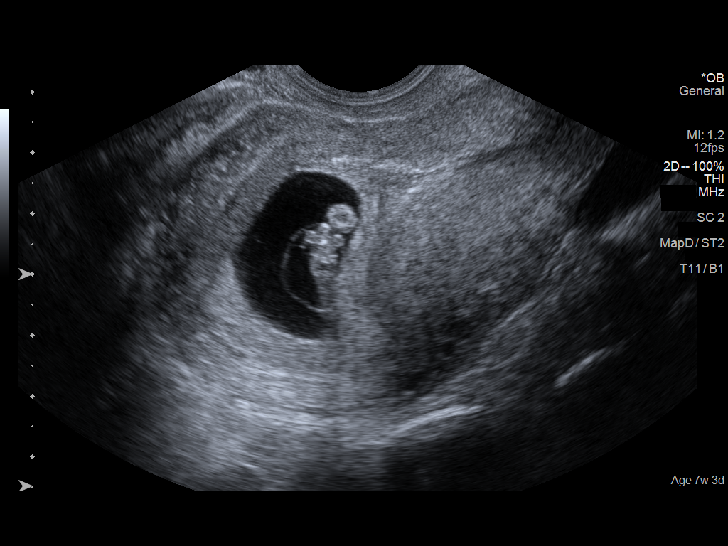
[im 74/99]
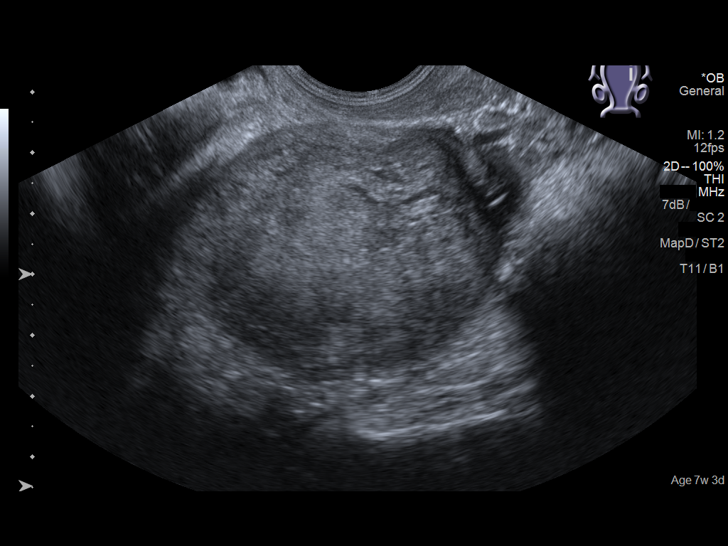
[im 82/99]
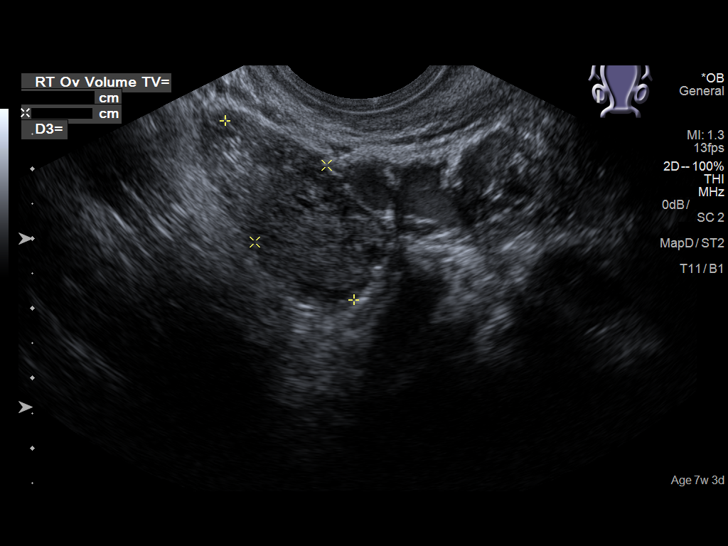
[im 90/99]
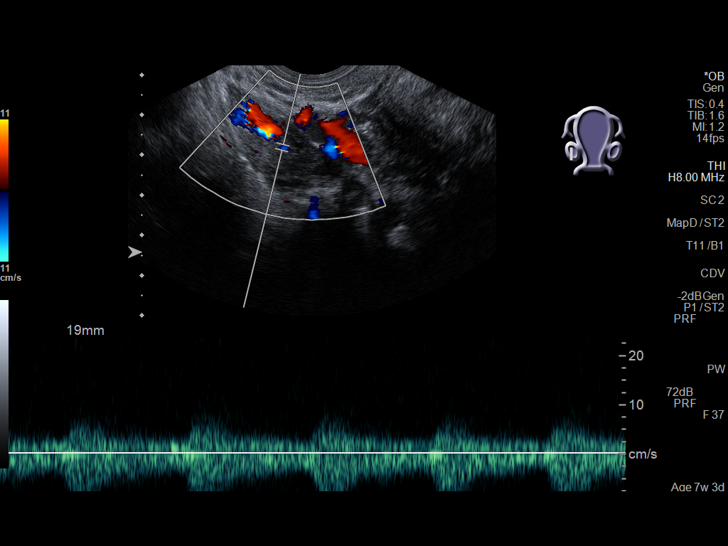
[im 99/99]
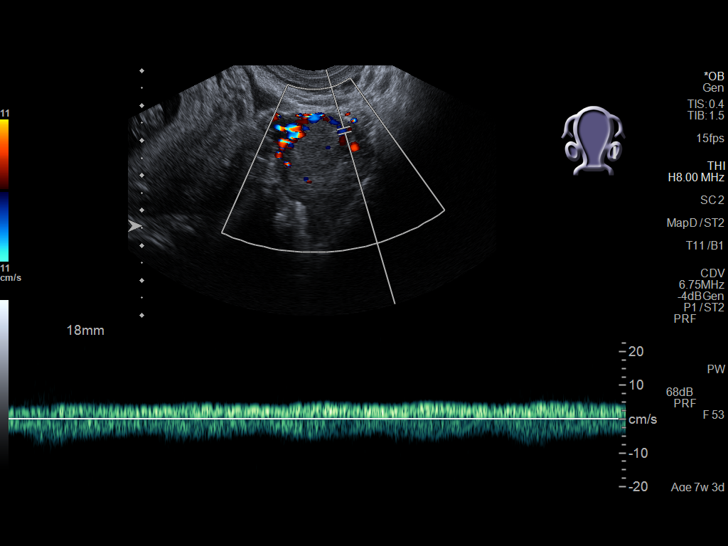

[13 of 25 positions shown; findings below may reference images not displayed]

FINDINGS: Intrauterine gestational sac: Single

Yolk sac:  Present

Embryo:  Present

Cardiac Activity: Present

Heart Rate: 158 bpm

CRL: 13.5 mm   7 w 4 d                  US EDC: 10/17/2018

Subchorionic hemorrhage:  None visualized.

Maternal uterus/adnexae: A corpus luteal cyst of the left ovary
measures 2.8 cm maximally. A small amount of free fluid is present.

Pulsed Doppler evaluation of both ovaries demonstrates normal
appearing low-resistance arterial and venous waveforms.
IMPRESSION: 1. Unremarkable single intrauterine pregnancy with estimated
gestational age of 7 weeks and 4 days.
2. Normal sonographic appearance of the ovaries bilaterally with
normal arterial and venous waveforms.
3. Probable corpus luteal cyst on the left.
4. Small amount of free fluid.  The
